# Patient Record
Sex: Female | Born: 1965 | Race: Black or African American | Hispanic: No | Marital: Single | Smoking: Never smoker
Health system: Southern US, Community
[De-identification: ages and names within clinical notes are randomized; demographics above are authoritative.]

## PROBLEM LIST (undated history)

## (undated) HISTORY — PX: CARPAL TUNNEL RELEASE: SHX101

---

## 2015-02-20 ENCOUNTER — Emergency Department: Payer: BC Managed Care – PPO

## 2015-02-20 ENCOUNTER — Encounter: Payer: Self-pay | Admitting: Medical Oncology

## 2015-02-20 ENCOUNTER — Emergency Department
Admission: EM | Admit: 2015-02-20 | Discharge: 2015-02-20 | Disposition: A | Discharge status: Home / Self Care | Payer: BC Managed Care – PPO | Attending: Emergency Medicine | Admitting: Emergency Medicine

## 2015-02-20 DIAGNOSIS — R0789 Other chest pain: Secondary | ICD-10-CM | POA: Diagnosis not present

## 2015-02-20 DIAGNOSIS — M79602 Pain in left arm: Secondary | ICD-10-CM | POA: Diagnosis not present

## 2015-02-20 DIAGNOSIS — R079 Chest pain, unspecified: Secondary | ICD-10-CM | POA: Diagnosis present

## 2015-02-20 LAB — BASIC METABOLIC PANEL
Anion gap: 7 (ref 5–15)
BUN: 13 mg/dL (ref 6–20)
CHLORIDE: 105 mmol/L (ref 101–111)
CO2: 26 mmol/L (ref 22–32)
CREATININE: 0.79 mg/dL (ref 0.44–1.00)
Calcium: 9.3 mg/dL (ref 8.9–10.3)
GFR calc Af Amer: >60 mL/min (ref >60)
GFR calc non Af Amer: >60 mL/min (ref >60)
Glucose, Bld: 84 mg/dL (ref 65–99)
POTASSIUM: 3.5 mmol/L (ref 3.5–5.1)
Sodium: 138 mmol/L (ref 135–145)

## 2015-02-20 LAB — CBC
HEMATOCRIT: 37.9 % (ref 35.0–47.0)
Hemoglobin: 12.7 g/dL (ref 12.0–16.0)
MCH: 30.8 pg (ref 26.0–34.0)
MCHC: 33.6 g/dL (ref 32.0–36.0)
MCV: 91.9 fL (ref 80.0–100.0)
PLATELETS: 215 10*3/uL (ref 150–440)
RBC: 4.12 MIL/uL (ref 3.80–5.20)
RDW: 13.8 % (ref 11.5–14.5)
WBC: 8.4 10*3/uL (ref 3.6–11.0)

## 2015-02-20 LAB — TROPONIN I: Troponin I: <0.03 ng/mL (ref <0.031)

## 2015-02-20 MED ORDER — IBUPROFEN 600 MG PO TABS
600.0000 mg | ORAL_TABLET | Freq: Once | ORAL | Status: AC
Start: 2015-02-20 — End: 2015-02-20
  Administered 2015-02-20: 600 mg via ORAL
  Filled 2015-02-20: qty 1

## 2015-02-20 NOTE — ED Provider Notes (Addendum)
[facility] Emergency Department Provider Note  ____________________________________________   I have reviewed the triage vital signs and the nursing notes.   HISTORY  Chief Complaint Chest Pain    HPI Daisy Acosta is a 49 y.o. female who is healthy aside from chronic headaches, and various other muscles also issues including other emergency room visits for noncardiac chest pain. Patient has an the past and referred as a precaution to cardiology but she was not able to go to those appointments. She states she's been under some stress recently and at 4:00 this morning she woke up lying on her left arm and found the left side of her chest wall was tender. The patient states the pain is continued from that point until now. She denies any periods without. chest pain. She states after she got aspirin from the paramedics the pain began to "melt away a little". She had no shortness of breath. Initially when she first woke up her left arm was also somewhat sore where she was lying on it, but that has gone.. It was worse when she touches it worse when she changes position. No recollected injury. No difficulty breathing. It is not pleuritic. She states she might of been sweaty at one point but she states she was also somewhat anxious at the moment. This is similar to prior costochondritic presentations according to patient. She does sleep on that side. She denies any personal or family history of PE or DVT she denies recent travel she denies recent surgery she denies taking birth control pills she denies any unilateral leg swelling she denies history of cancer. The patient denies any recent URI symptoms, she denies cough, she denies any recollected trauma  History reviewed. No pertinent past medical history.  There are no active problems to display for this patient.   Past Surgical History  Procedure Laterality Date  . Carpal tunnel release      No current outpatient  prescriptions on file.  Allergies Review of patient's allergies indicates no known allergies.  No family history on file.  Social History Social History  Substance Use Topics  . Smoking status: Never Smoker   . Smokeless tobacco: None  . Alcohol Use: No    Review of Systems Constitutional: No fever/chills Eyes: No visual changes. ENT: No sore throat. No stiff neck no neck pain Cardiovascular: See history of present illness Respiratory: Denies shortness of breath. Gastrointestinal:   no vomiting.  No diarrhea.  No constipation. Genitourinary: Negative for dysuria. Musculoskeletal: Negative lower extremity swelling Skin: Negative for rash. Neurological: Negative for headaches, focal weakness or numbness. 10-point ROS otherwise negative.  ____________________________________________   PHYSICAL EXAM:  VITAL SIGNS: ED Triage Vitals  Enc Vitals Group     BP 02/20/15 1422 111/60 mmHg     Pulse Rate 02/20/15 1416 60     Resp 02/20/15 1416 17     Temp 02/20/15 1416 97.8 F (36.6 C)     Temp Source 02/20/15 1416 Oral     SpO2 02/20/15 1416 97 %     Weight 02/20/15 1416 145 lb (65.772 kg)     Height 02/20/15 1416 5\' 4"  (1.626 m)     Head Cir --      Peak Flow --      Pain Score 02/20/15 1419 2     Pain Loc --      Pain Edu? --      Excl. in GC? --     Constitutional: Alert and oriented.  Well appearing and in no acute distress. Patient is somewhat upset talking to her friend but in no acute medical distress Eyes: Conjunctivae are normal. PERRL. EOMI. Head: Atraumatic. Nose: No congestion/rhinnorhea. Mouth/Throat: Mucous membranes are moist.  Oropharynx non-erythematous. Neck: No stridor.   Nontender with no meningismus Cardiovascular: Normal rate, regular rhythm. Grossly normal heart sounds.  Good peripheral circulation. Chest wall: Tender to palpation in the left chest wall the costochondral margin around T4-5, there is no crepitus there is no flail chest there are no  shingles there is no inflammation noted no skin changes there are no shingles when I touch this area patient states "ouch that's the pain right there" and pulls back. Respiratory: Normal respiratory effort.  No retractions. Lungs CTAB. Abdominal: Soft and nontender. No distention. No guarding no rebound Back:  There is no focal tenderness or step off there is no midline tenderness there are no lesions noted. there is no CVA tenderness Musculoskeletal: No lower extremity tenderness. No joint effusions, no DVT signs strong distal pulses no edema Neurologic:  Normal speech and language. No gross focal neurologic deficits are appreciated.  Skin:  Skin is warm, dry and intact. No rash noted. Psychiatric: Mood and affect are anxious. Speech and behavior are normal.  ____________________________________________   LABS (all labs ordered are listed, but only abnormal results are displayed)  Labs Reviewed  BASIC METABOLIC PANEL  CBC  TROPONIN I   ____________________________________________  EKG  I personally interpreted any EKGs ordered by me or triage Normal sinus bradycardia rate 56 bpm no acute ST elevation no acute ST depression, RSR prime configuration noted anteriorly. Likely partial right bundle branch block with the usual EKG changes associated with it. No acute ischemia. Incomplete bundle branch block was also noted on prior EKGs in June 2016 ____________________________________________  RADIOLOGY  I reviewed any imaging ordered by me or triage that were performed during my shift ____________________________________________   PROCEDURES  Procedure(s) performed: None  Critical Care performed: None  ____________________________________________   INITIAL IMPRESSION / ASSESSMENT AND PLAN / ED COURSE  Pertinent labs & imaging results that were available during my care of the patient were reviewed by me and considered in my medical decision making (see chart for  details).  Patient presents with very reproducible uninterrupted chest wall pain for the last 12 hours. She has minimal CAD risk factors, I do not believe this represents CAD. Reassuringly, her troponin is negative despite 12 hours of ongoing discomfort. Patient is perk negative, and I do not believe she has a PE. I do not believe she has a dissection. Chest x-ray which raise no evidence of pneumothorax or pneumonia. I do not think there is evidence at this time to support a diagnosis of myocarditis pericarditis endocarditis pericardial effusion or mediastinal pathology such as Boerhaave's. At this time there is no evidence either of any referred intra-abdominal pathology that is resulting in this discomfort although the patient did ask if it could be gas, her abdomen is completely benign and she again has repeatable chest wall pain at the costochondral margin. I do not think serial cardiac enzymes will be of utility given a complete normal troponin after 12 hours of pain. Patient has very reproducible chest wall pain and otherwise reassuring workup. I have explained to her that my usual and customary plan for this kind of issue is to have the patient follow up with cardiology do put her mind at ease and allow her to contact she'll lysis of it happens again.  Extensive return precautions and follow-up of an given and understood and patient will return for any new or worrisome symptoms. I've advised her that it is in her best interest to follow-up closely. Patient has recurrent with this plan. ____________________________________________   FINAL CLINICAL IMPRESSION(S) / ED DIAGNOSES  Final diagnoses:  None     Jeanmarie Plant, MD 02/20/15 1534  Jeanmarie Plant, MD 02/20/15 1534  Jeanmarie Plant, MD 02/20/15 1537  Jeanmarie Plant, MD 02/20/15 1538

## 2015-02-20 NOTE — ED Notes (Addendum)
Pt to ED via ems with reports she was woke up from sleep around 0430 this am with left sided chest pain, pain has been off and on since, pt has episodes of sweating also. Denies sob. EMS administered 324mg  ASA pta.

## 2015-02-20 NOTE — Discharge Instructions (Signed)
Chest Wall Pain Chest wall pain is pain in or around the bones and muscles of your chest. Sometimes, an injury causes this pain. Sometimes, the cause may not be known. This pain may take several weeks or longer to get better. HOME CARE INSTRUCTIONS  Pay attention to any changes in your symptoms. Take these actions to help with your pain:   Rest as told by your health care provider.   Avoid activities that cause pain. These include any activities that use your chest muscles or your abdominal and side muscles to lift heavy items.   If directed, apply ice to the painful area:  Put ice in a plastic bag.  Place a towel between your skin and the bag.  Leave the ice on for 20 minutes, 2-3 times per day.  Take over-the-counter and prescription medicines only as told by your health care provider.  Do not use tobacco products, including cigarettes, chewing tobacco, and e-cigarettes. If you need help quitting, ask your health care provider.  Keep all follow-up visits as told by your health care provider. This is important. SEEK MEDICAL CARE IF:  You have a fever.  Your chest pain becomes worse.  You have new symptoms. SEEK IMMEDIATE MEDICAL CARE IF:  You have nausea or vomiting.  You feel sweaty or light-headed.  You have a cough with phlegm (sputum) or you cough up blood.  You develop shortness of breath.  You have any change in the character or intensity of your pain  There is any other new or worrisome symptoms   This information is not intended to replace advice given to you by your health care provider. Make sure you discuss any questions you have with your health care provider.   Document Released: 02/09/2005 Document Revised: 10/31/2014 Document Reviewed: 05/07/2014 Elsevier Interactive Patient Education [employer]2016 [employer]

## 2017-01-14 IMAGING — CR DG CHEST 2V
1 series · 2 of 2 positions shown · non-contrast
Comparison: None.

CLINICAL DATA: Chest pain

EXAM:
CHEST  2 VIEW

[Series 1: w chest pa · 0.14mm/px · 2 of 2 slices shown]
[im 1/2]
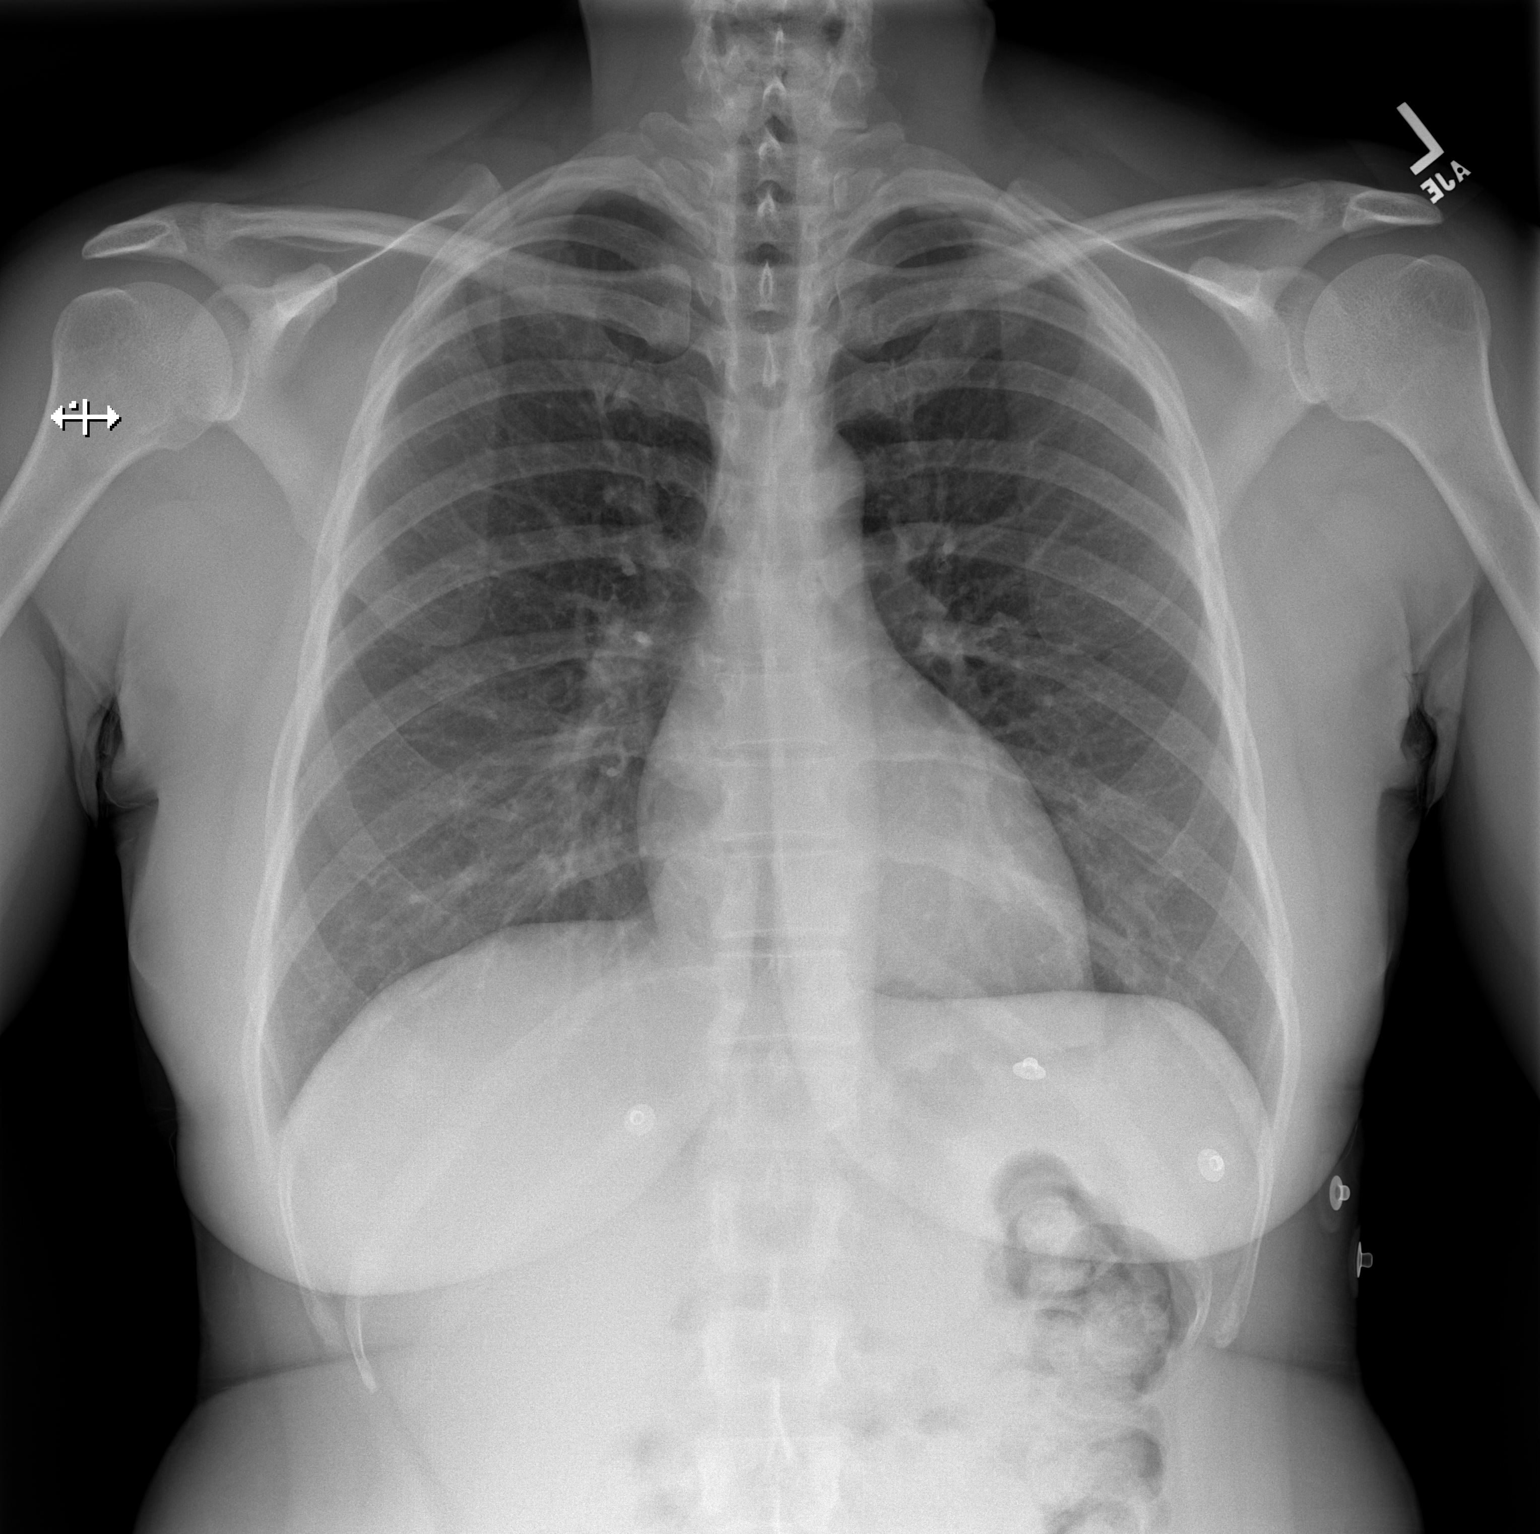
[im 2/2]
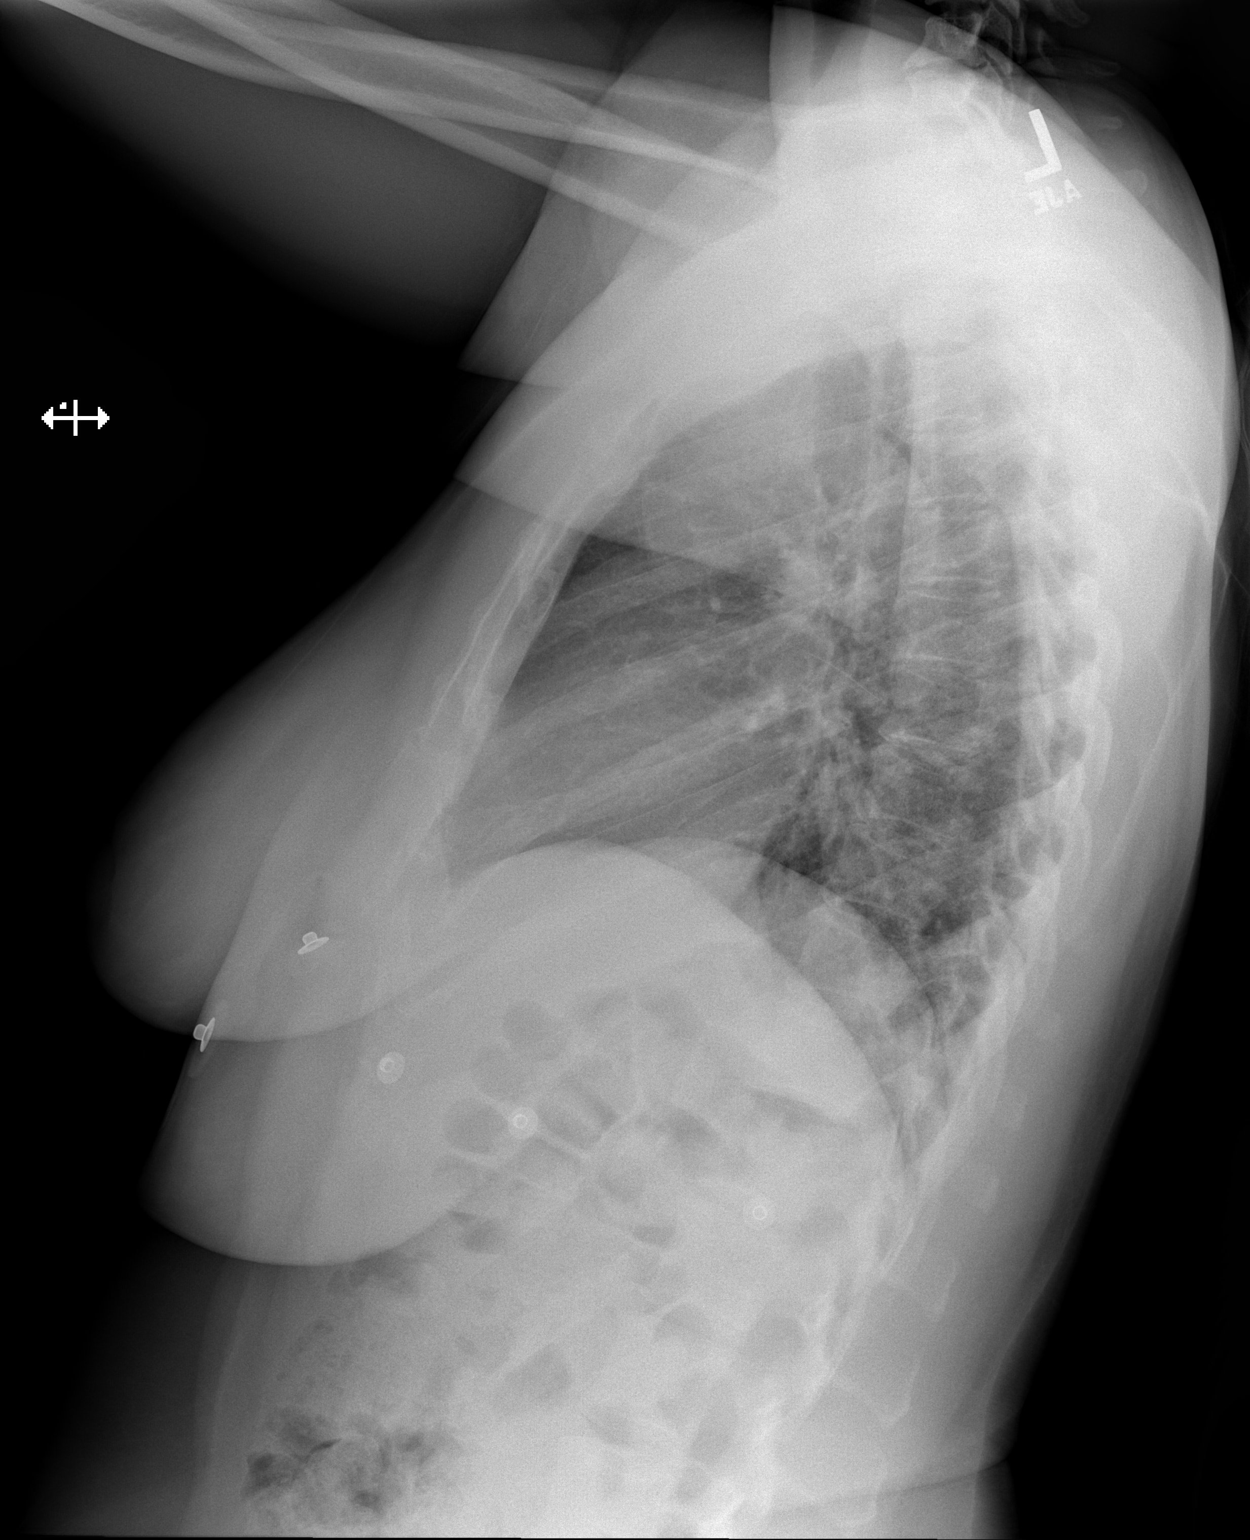

[2 of 2 positions shown; findings below may reference images not displayed]

FINDINGS: Lungs are clear.  No pleural effusion or pneumothorax.

The heart is normal in size.

Visualized osseous structures are within normal limits.
IMPRESSION: Normal chest radiographs.
